# Patient Record
Sex: Female | Born: 1970 | Hispanic: Yes | Marital: Married | Smoking: Never smoker
Health system: Southern US, Community
[De-identification: ages and names within clinical notes are randomized; demographics above are authoritative.]

## PROBLEM LIST (undated history)

## (undated) DIAGNOSIS — E119 Type 2 diabetes mellitus without complications: Secondary | ICD-10-CM

## (undated) HISTORY — DX: Type 2 diabetes mellitus without complications: E11.9

---

## 2004-01-03 ENCOUNTER — Emergency Department: Payer: Self-pay | Admitting: Emergency Medicine

## 2005-07-31 ENCOUNTER — Ambulatory Visit: Payer: Self-pay | Admitting: Family Medicine

## 2005-09-14 ENCOUNTER — Ambulatory Visit: Payer: Self-pay | Admitting: Family Medicine

## 2006-01-25 ENCOUNTER — Inpatient Hospital Stay: Payer: Self-pay

## 2007-01-14 ENCOUNTER — Other Ambulatory Visit: Payer: Self-pay

## 2007-01-14 ENCOUNTER — Emergency Department: Payer: Self-pay | Admitting: Emergency Medicine

## 2007-01-15 ENCOUNTER — Inpatient Hospital Stay: Payer: Self-pay | Admitting: Surgery

## 2007-03-23 ENCOUNTER — Emergency Department: Payer: Self-pay | Admitting: Emergency Medicine

## 2007-03-23 ENCOUNTER — Other Ambulatory Visit: Payer: Self-pay

## 2008-01-27 ENCOUNTER — Inpatient Hospital Stay: Payer: Self-pay | Admitting: Internal Medicine

## 2008-01-30 ENCOUNTER — Ambulatory Visit: Payer: Self-pay | Admitting: General Surgery

## 2008-02-04 ENCOUNTER — Ambulatory Visit: Payer: Self-pay | Admitting: General Surgery

## 2009-03-31 ENCOUNTER — Emergency Department: Payer: Self-pay | Admitting: Emergency Medicine

## 2018-10-14 ENCOUNTER — Other Ambulatory Visit: Payer: Self-pay

## 2018-10-14 DIAGNOSIS — Z20822 Contact with and (suspected) exposure to covid-19: Secondary | ICD-10-CM

## 2018-10-16 ENCOUNTER — Telehealth: Payer: Self-pay | Admitting: *Deleted

## 2018-10-16 LAB — NOVEL CORONAVIRUS, NAA: SARS-CoV-2, NAA: DETECTED — AB

## 2018-10-16 NOTE — Telephone Encounter (Signed)
Pt called back for covid-19 test results using [employer] # [phone]. Pt was given the positive result of covid-19. She has been having symptoms of fever and  bone pain. She stated most of her family had tested positive and had had fevers. She had been using home remedies and taking tylenol for fever and pain. She stated her fever had gone now. Advised that she needs to stay in quarantine for 14 days without fever.  She is also advised to stay hydrated, not to go out unless absolutely necessary and always with a mask on and social distancing. To get rest but also get fresh air. To call 911 for respiratory distress. She voiced understanding. Routing to Dr. Jodi Mourning. Per result note, health department notified.

## 2018-10-24 ENCOUNTER — Other Ambulatory Visit: Payer: Self-pay

## 2018-10-24 ENCOUNTER — Emergency Department: Payer: Self-pay

## 2018-10-24 ENCOUNTER — Emergency Department
Admission: EM | Admit: 2018-10-24 | Discharge: 2018-10-24 | Disposition: A | Discharge status: Home / Self Care | Payer: Self-pay | Attending: Emergency Medicine | Admitting: Emergency Medicine

## 2018-10-24 DIAGNOSIS — U071 COVID-19: Secondary | ICD-10-CM | POA: Insufficient documentation

## 2018-10-24 DIAGNOSIS — M79674 Pain in right toe(s): Secondary | ICD-10-CM | POA: Insufficient documentation

## 2018-10-24 DIAGNOSIS — L089 Local infection of the skin and subcutaneous tissue, unspecified: Secondary | ICD-10-CM | POA: Insufficient documentation

## 2018-10-24 MED ORDER — CEPHALEXIN 500 MG PO CAPS: 500.0000 mg | ORAL_CAPSULE | Freq: Four times a day (QID) | ORAL | 0 refills | Status: AC

## 2018-10-24 NOTE — Discharge Instructions (Addendum)
Please seek medical attention for any high fevers, chest pain, shortness of breath, change in behavior, persistent vomiting, bloody stool or any other new or concerning symptoms.  

## 2018-10-24 NOTE — ED Triage Notes (Signed)
Pt arrived with mother who is checking in for COVID 19. Pt wants to be seen as well for right 5th toe swelling and wound. Pt has redness, swelling and sores to the area. Pt reports she has been in contact with PCP and was instructed to come to ED for eval. Pt denies fever but has dry cough in triage.

## 2018-10-24 NOTE — ED Provider Notes (Signed)
[facility] Emergency Department Provider Note  ____________________________________________   I have reviewed the triage vital signs and the nursing notes.   HISTORY  Chief Complaint Foot Pain   History limited by: Language [facility]   HPI Makayla Young is a 48 y.o. female who presents to the emergency department today because of concern for right fifth toe pain and swelling. The patient states that the pain started after she wore tight shows a roughly 2 weeks ago. Since then the pain and swelling has increased. She does have a history of diabetes but denies similar symptoms in the past. She was recently tested positive for COVID.    Records reviewed. Per medical record review patient has a history of recent COVID positive test.   No past medical history on file.  There are no active problems to display for this patient.   Prior to Admission medications   Not on File    Allergies Patient has no known allergies.  No family history on file.  Social History Social History   Tobacco Use  . Smoking status: Not on file  Substance Use Topics  . Alcohol use: Not on file  . Drug use: Not on file    Review of Systems Constitutional: No fever/chills Eyes: No visual changes. ENT: No sore throat. Cardiovascular: Denies chest pain. Respiratory: Denies shortness of breath. Gastrointestinal: No abdominal pain.  No nausea, no vomiting.  No diarrhea.   Genitourinary: Negative for dysuria. Musculoskeletal: Positive for right fifth toe pain and swelling.  Skin: Negative for rash. Neurological: Negative for headaches, focal weakness or numbness.  ____________________________________________   PHYSICAL EXAM:  VITAL SIGNS: ED Triage Vitals [10/24/18 2110]  Enc Vitals Group     BP (!) 150/72     Pulse Rate 77     Resp 18     Temp 99 F (37.2 C)     Temp Source Oral     SpO2 100 %   Constitutional: Alert  and oriented.  Eyes: Conjunctivae are normal.  ENT      Head: Normocephalic and atraumatic.      Nose: No congestion/rhinnorhea.      Mouth/Throat: Mucous membranes are moist.      Neck: No stridor. Hematological/Lymphatic/Immunilogical: No cervical lymphadenopathy. Cardiovascular: Normal rate, regular rhythm.   Respiratory: Normal respiratory effort without tachypnea nor retractions.  Gastrointestinal: Soft and non tender. No rebound. No guarding.  Genitourinary: Deferred Musculoskeletal: Right 5th toe with swelling and erythema.  Neurologic:  Normal speech and language. No gross focal neurologic deficits are appreciated.  Skin:  Skin is warm, dry and intact. No rash noted. Psychiatric: Mood and affect are normal. Speech and behavior are normal. Patient exhibits appropriate insight and judgment.  ____________________________________________    LABS (pertinent positives/negatives)  None  ____________________________________________   EKG  None  ____________________________________________    RADIOLOGY  Right fifth toe No osseous abnormality  ____________________________________________   PROCEDURES  Procedures  ____________________________________________   INITIAL IMPRESSION / ASSESSMENT AND PLAN / ED COURSE  Pertinent labs & imaging results that were available during my care of the patient were reviewed by me and considered in my medical decision making (see chart for details).   Patient presented to the emergency department today because of concern for right fifth toe swelling and pain. Patient does have history of diabetes. On exam toe is red and swollen. X-ray without concerning osseous abnormality. Will plan on starting antibiotics. Discussed with patient importance of follow  up.  ____________________________________________   FINAL CLINICAL IMPRESSION(S) / ED DIAGNOSES  Final diagnoses:  Foot infection     Note: This dictation was prepared with  Dragon dictation. Any transcriptional errors that result from this process are unintentional     Nance Pear, MD 10/24/18 2240

## 2018-10-24 NOTE — ED Notes (Signed)
Information for discharge and assessment provided with assist of alex spanish interpreter [phone].

## 2019-10-03 IMAGING — DX RIGHT FIFTH TOE
3 series · 3 of 3 positions shown · non-contrast
Comparison: None.

CLINICAL DATA: Right fifth toe swelling and discoloration.

EXAM:
RIGHT FIFTH TOE

[toe ap]
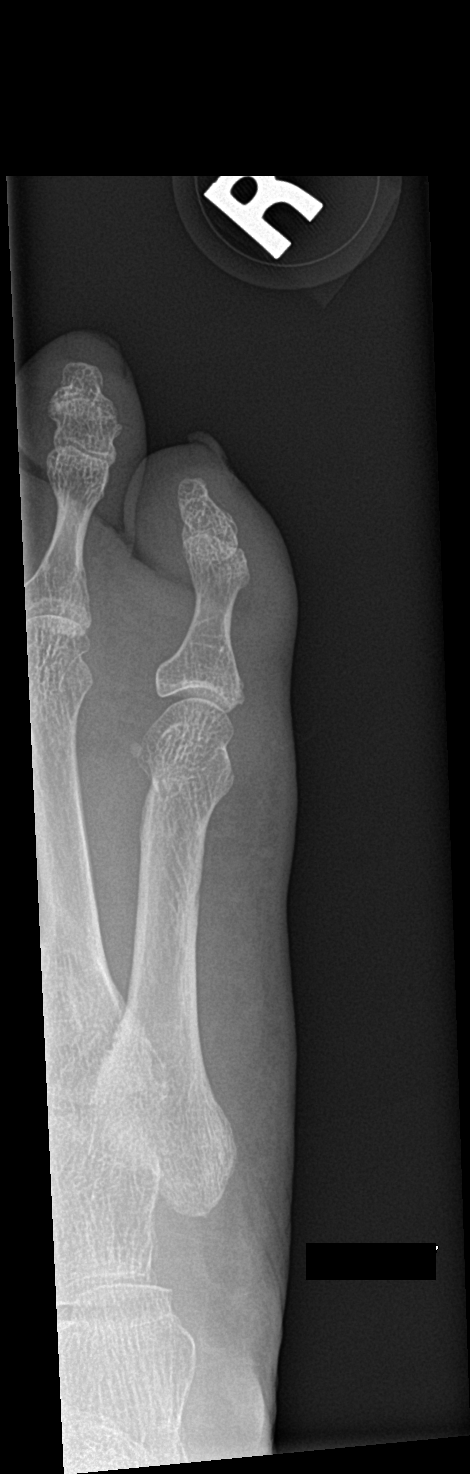

[toe obl]
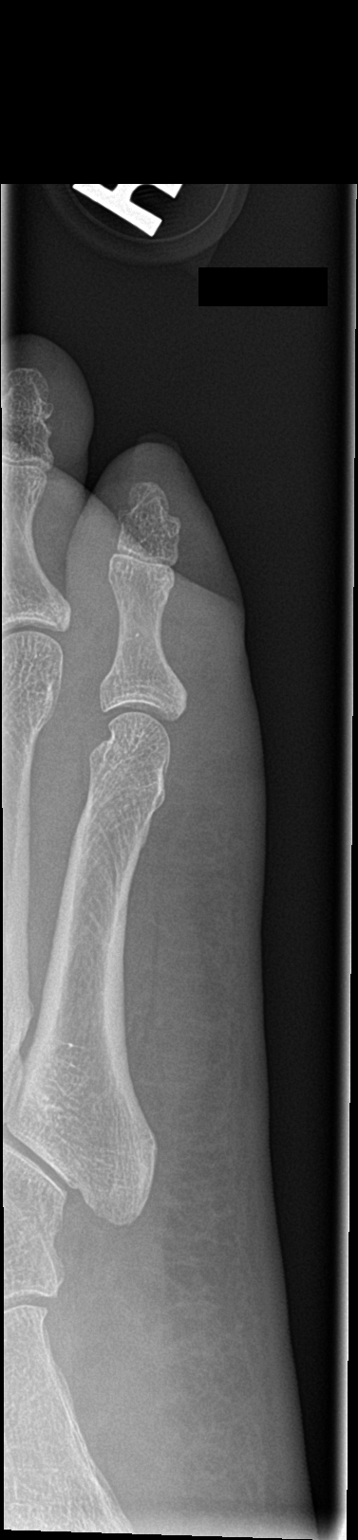

[toe lat]
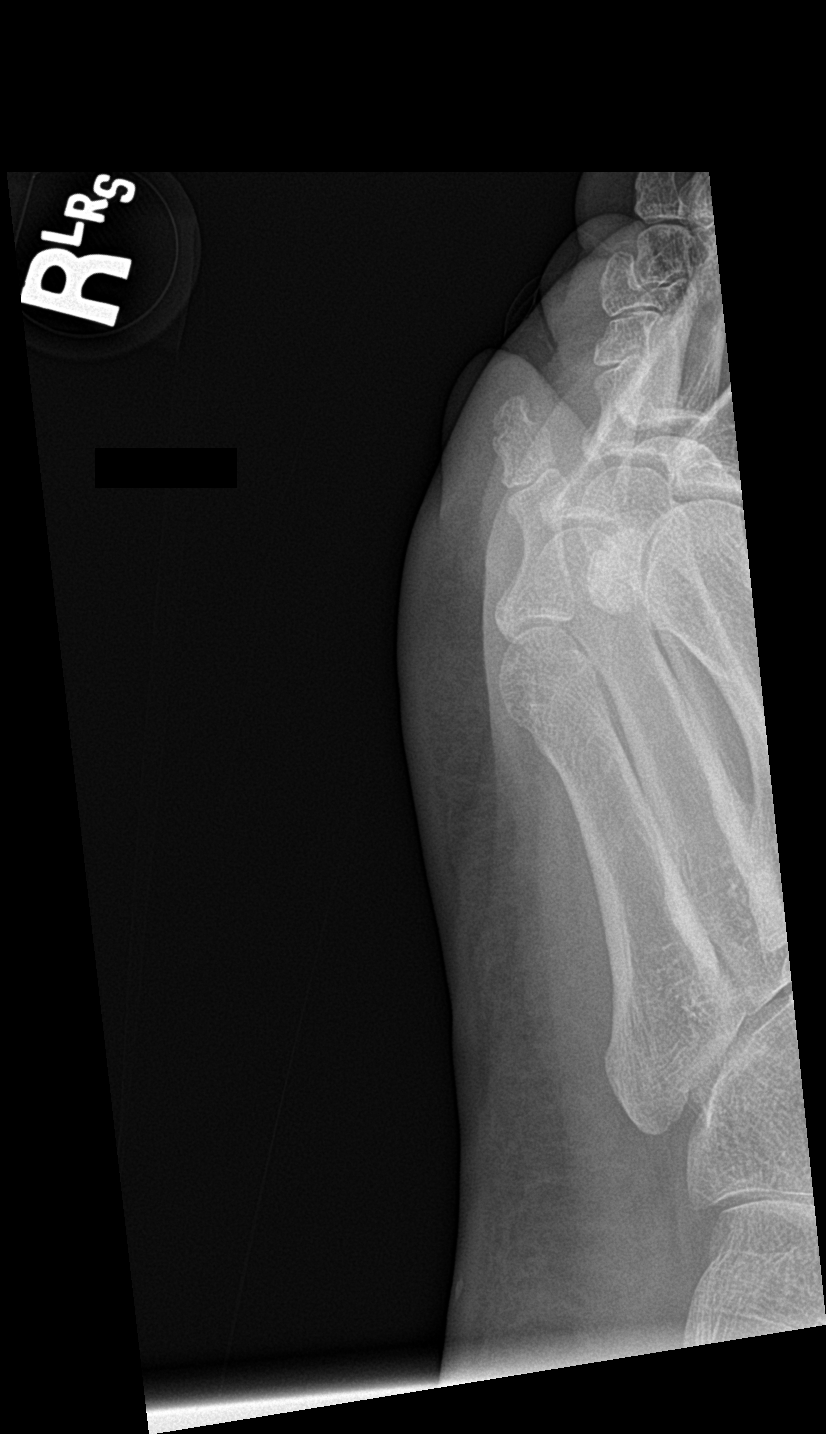

[3 of 3 positions shown; findings below may reference images not displayed]

FINDINGS: There is no evidence of fracture or dislocation. Diffuse soft tissue
swelling of the right fifth toe.
IMPRESSION: No acute fracture or dislocation identified about the right fifth
toe.

Diffuse soft tissue swelling.

## 2023-05-07 ENCOUNTER — Encounter: Payer: Self-pay | Admitting: Family Medicine

## 2023-06-05 ENCOUNTER — Other Ambulatory Visit: Payer: Self-pay

## 2023-06-05 DIAGNOSIS — Z1231 Encounter for screening mammogram for malignant neoplasm of breast: Secondary | ICD-10-CM

## 2023-06-24 ENCOUNTER — Ambulatory Visit: Payer: Self-pay

## 2023-09-09 ENCOUNTER — Ambulatory Visit
Admission: RE | Admit: 2023-09-09 | Discharge: 2023-09-09 | Disposition: A | Discharge status: Home / Self Care | Payer: Self-pay | Source: Ambulatory Visit | Attending: Obstetrics and Gynecology | Admitting: Obstetrics and Gynecology

## 2023-09-09 ENCOUNTER — Ambulatory Visit: Payer: Self-pay | Attending: Obstetrics and Gynecology | Admitting: *Deleted

## 2023-09-09 VITALS — BP 130/71 | Wt 192.2 lb

## 2023-09-09 DIAGNOSIS — Z1239 Encounter for other screening for malignant neoplasm of breast: Secondary | ICD-10-CM

## 2023-09-09 DIAGNOSIS — Z1231 Encounter for screening mammogram for malignant neoplasm of breast: Secondary | ICD-10-CM | POA: Insufficient documentation

## 2023-09-09 NOTE — Patient Instructions (Addendum)
 Explained breast self awareness with Makayla Young. Pap smear is due today. Patient refused Pap smear today due to stated she has appointment scheduled with her PCP to have completed. Let her know [facility] will cover Pap smears every 3 years or Pap smears and HPV typing unless has a history of abnormal Pap smears. Referred patient to the [facility] for a screening mammogram. Appointment scheduled Monday, September 09, 2023 at 1040. Patient aware of appointment and will be there. Let patient know Raymondo will follow up with her within the next couple weeks with results of mammogram by letter or phone. [name] verbalized understanding.  Rainie Crenshaw, Wanda Ship, RN 10:40 AM

## 2023-09-09 NOTE — Progress Notes (Signed)
 [name] is a 53 y.o. female who presents to [facility] clinic today with no complaints.    Pap Smear: Pap smear not completed today. Last Pap smear was 11/13/2016 at [facility] clinic and was normal with negative HPV. Patient has history of an abnormal Pap smear 12/18/2013 that was ASCUS with positive HPV that no follow up was completed. Last Pap smear result is available in Epic.   Physical exam: Breasts Breasts symmetrical. No skin abnormalities bilateral breasts. No nipple retraction bilateral breasts. No nipple discharge bilateral breasts. No lymphadenopathy. No lumps palpated bilateral breasts. No complaints of pain or tenderness on exam.   Pelvic/Bimanual Pap smear is due today. Patient refused Pap smear today due to stated she has appointment scheduled with her PCP to have completed.    Smoking History: Patient has never smoked.   Patient Navigation: Patient education provided. Access to services provided for patient through [employer] program. Spanish interpreter Makayla Young from [facility] provided.   Colorectal Cancer Screening: Per patient has never had colonoscopy completed. No complaints today.    Breast and Cervical Cancer Risk Assessment: Patient does not have family history of breast cancer, known genetic mutations, or radiation treatment to the chest before age 53. Patient does not have history of cervical dysplasia, immunocompromised, or DES exposure in-utero.  Risk Scores as of Encounter on 09/09/2023     Makayla Young           5-year 0.66%   Lifetime 5.86%   This patient is [place]/Latina but has no documented birth country, so the [place] model used data from [place] patients to calculate their risk score. Document a birth country in the Demographics activity for a more accurate score.         Last calculated by Makayla Wanda SQUIBB, RN on 09/09/2023 at 10:21 PM       A: [facility] exam without pap smear No complaints.  P: Referred patient to the  [facility] for a screening mammogram. Appointment scheduled Monday, September 09, 2023 at 1040.  Makayla Wanda SQUIBB, RN 09/09/2023 10:40 AM

## 2023-09-11 ENCOUNTER — Encounter: Payer: Self-pay | Admitting: Obstetrics and Gynecology

## 2023-09-11 ENCOUNTER — Other Ambulatory Visit: Payer: Self-pay | Admitting: Obstetrics and Gynecology

## 2023-09-11 DIAGNOSIS — R928 Other abnormal and inconclusive findings on diagnostic imaging of breast: Secondary | ICD-10-CM

## 2023-09-13 ENCOUNTER — Ambulatory Visit
Admission: RE | Admit: 2023-09-13 | Discharge: 2023-09-13 | Disposition: A | Discharge status: Home / Self Care | Payer: Self-pay | Source: Ambulatory Visit | Attending: Obstetrics and Gynecology | Admitting: Obstetrics and Gynecology

## 2023-09-13 DIAGNOSIS — R928 Other abnormal and inconclusive findings on diagnostic imaging of breast: Secondary | ICD-10-CM | POA: Insufficient documentation
